# Patient Record
Sex: Male | Born: 1990 | Hispanic: Yes | Marital: Married | Smoking: Former smoker
Health system: Southern US, Community
[De-identification: ages and names within clinical notes are randomized; demographics above are authoritative.]

## PROBLEM LIST (undated history)

## (undated) DIAGNOSIS — J45909 Unspecified asthma, uncomplicated: Secondary | ICD-10-CM

## (undated) HISTORY — PX: TONSILLECTOMY: SUR1361

## (undated) HISTORY — PX: INNER EAR SURGERY: SHX679

---
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
## 2011-11-12 ENCOUNTER — Emergency Department: Payer: Self-pay | Admitting: Unknown Physician Specialty

## 2014-05-20 ENCOUNTER — Emergency Department: Payer: Self-pay | Admitting: Emergency Medicine

## 2014-11-11 ENCOUNTER — Emergency Department: Payer: Self-pay | Admitting: Emergency Medicine

## 2014-11-12 LAB — COMPREHENSIVE METABOLIC PANEL
ANION GAP: 6 — AB (ref 7–16)
Albumin: 4 g/dL (ref 3.4–5.0)
Alkaline Phosphatase: 103 U/L
BUN: 11 mg/dL (ref 7–18)
Bilirubin,Total: 0.6 mg/dL (ref 0.2–1.0)
CALCIUM: 8.7 mg/dL (ref 8.5–10.1)
CHLORIDE: 107 mmol/L (ref 98–107)
Co2: 27 mmol/L (ref 21–32)
Creatinine: 0.69 mg/dL (ref 0.60–1.30)
EGFR (African American): >60
GLUCOSE: 90 mg/dL (ref 65–99)
Osmolality: 278 (ref 275–301)
Potassium: 4.2 mmol/L (ref 3.5–5.1)
SGOT(AST): 40 U/L — ABNORMAL HIGH (ref 15–37)
SGPT (ALT): 63 U/L
SODIUM: 140 mmol/L (ref 136–145)
Total Protein: 7.6 g/dL (ref 6.4–8.2)

## 2014-11-12 LAB — CBC
HCT: 47.4 % (ref 40.0–52.0)
HGB: 15.9 g/dL (ref 13.0–18.0)
MCH: 29.7 pg (ref 26.0–34.0)
MCHC: 33.4 g/dL (ref 32.0–36.0)
MCV: 89 fL (ref 80–100)
PLATELETS: 327 10*3/uL (ref 150–440)
RBC: 5.35 10*6/uL (ref 4.40–5.90)
RDW: 12.9 % (ref 11.5–14.5)
WBC: 9 10*3/uL (ref 3.8–10.6)

## 2014-11-12 LAB — ETHANOL: Ethanol: <3 mg/dL

## 2015-02-14 ENCOUNTER — Emergency Department: Payer: Self-pay | Admitting: Emergency Medicine

## 2016-06-27 ENCOUNTER — Emergency Department
Admission: EM | Admit: 2016-06-27 | Discharge: 2016-06-27 | Disposition: A | Discharge status: Home / Self Care | Payer: Self-pay | Attending: Emergency Medicine | Admitting: Emergency Medicine

## 2016-06-27 ENCOUNTER — Encounter: Payer: Self-pay | Admitting: Emergency Medicine

## 2016-06-27 DIAGNOSIS — J45909 Unspecified asthma, uncomplicated: Secondary | ICD-10-CM | POA: Insufficient documentation

## 2016-06-27 DIAGNOSIS — Z87891 Personal history of nicotine dependence: Secondary | ICD-10-CM | POA: Insufficient documentation

## 2016-06-27 DIAGNOSIS — H6693 Otitis media, unspecified, bilateral: Secondary | ICD-10-CM | POA: Insufficient documentation

## 2016-06-27 HISTORY — DX: Unspecified asthma, uncomplicated: J45.909

## 2016-06-27 MED ORDER — AMOXICILLIN-POT CLAVULANATE 875-125 MG PO TABS
1.0000 | ORAL_TABLET | Freq: Once | ORAL | Status: AC
  Administered 2016-06-27: 1 via ORAL

## 2016-06-27 MED ORDER — AMOXICILLIN-POT CLAVULANATE 875-125 MG PO TABS
ORAL_TABLET | ORAL | Status: AC
  Administered 2016-06-27: 1 via ORAL
  Filled 2016-06-27: qty 1

## 2016-06-27 MED ORDER — AMOXICILLIN-POT CLAVULANATE 875-125 MG PO TABS
1.0000 | ORAL_TABLET | Freq: Two times a day (BID) | ORAL | 0 refills | Status: AC
Start: 2016-06-27 — End: 2016-07-07

## 2016-06-27 NOTE — ED Provider Notes (Signed)
[facility] [place] Emergency Department Provider Note  ____________________________________________   First MD Initiated Contact with Patient 06/27/16 [phone]     (approximate)  I have reviewed the triage vital signs and the nursing notes.   HISTORY  Chief Complaint Otalgia   HPI Cory Sanchez is a 25 y.o. male presents with right earache 2 weeks after going [place] the [place]. Patient denies any fever no nausea no vomiting.   Past Medical History:  Diagnosis Date  . Asthma     There are no active problems to display for this patient.   Past Surgical History:  Procedure Laterality Date  . INNER EAR SURGERY    . TONSILLECTOMY      Prior to Admission medications   Not on File    Allergies Review of patient's allergies indicates no known allergies.  No family history on file.  Social History Social History  Substance Use Topics  . Smoking status: Former Games developer  . Smokeless tobacco: Never Used  . Alcohol use Not on file    Review of Systems Constitutional: No fever/chills Eyes: No visual changes. ENT: No sore throat.Positive for right pain Cardiovascular: Denies chest pain. Respiratory: Denies shortness of breath. Gastrointestinal: No abdominal pain.  No nausea, no vomiting.  No diarrhea.  No constipation. Genitourinary: Negative for dysuria. Musculoskeletal: Negative for back pain. Skin: Negative for rash. Neurological: Negative for headaches, focal weakness or numbness.  10-point ROS otherwise negative.  ____________________________________________   PHYSICAL EXAM:  VITAL SIGNS: ED Triage Vitals  Enc Vitals Group     BP 06/27/16 0009 (!) 154/100     Pulse Rate 06/27/16 0009 (!) 101     Resp 06/27/16 0009 18     Temp 06/27/16 0009 97.9 F (36.6 C)     Temp Source 06/27/16 0009 Oral     SpO2 06/27/16 0009 98 %     Weight 06/27/16 0009 224 lb (101.6 kg)     Height 06/27/16 0009 5\' 8"  (1.727 m)     Head  Circumference --      Peak Flow --      Pain Score 06/27/16 0010 7     Pain Loc --      Pain Edu? --      Excl. in GC? --     Constitutional: Alert and oriented. Well appearing and in no acute distress. Eyes: Conjunctivae are normal. PERRL. EOMI. Head: Atraumatic. Ears:  Bilateral TM erythema and bulging tympanic membrane on the right Nose: No congestion/rhinnorhea. Mouth/Throat: Mucous membranes are moist.  Oropharynx non-erythematous. Neck: No stridor.  No meningeal signs. Cardiovascular: Normal rate, regular rhythm. Good peripheral circulation. Grossly normal heart sounds.   Respiratory: Normal respiratory effort.  No retractions. Lungs CTAB. Gastrointestinal: Soft and nontender. No distention.  Musculoskeletal: No lower extremity tenderness nor edema. No gross deformities of extremities. Neurologic:  Normal speech and language. No gross focal neurologic deficits are appreciated.  Skin:  Skin is warm, dry and intact. No rash noted.   ____________________________________________   LABS (all labs ordered are listed, but only abnormal results are displayed)  Labs Reviewed - No data to display   No results found.  ____________________________________________    Procedures   ____________________________________________   INITIAL IMPRESSION / ASSESSMENT AND PLAN / ED COURSE  Pertinent labs & imaging results that were available during my care of the patient were reviewed by me and considered in my medical decision making (see chart for details).  Patient given Augmentin 875 mg  will be prescribed same for home.  Clinical Course    ____________________________________________  FINAL CLINICAL IMPRESSION(S) / ED DIAGNOSES  Final diagnoses:  Bilateral acute otitis media, recurrence not specified, unspecified otitis media type     MEDICATIONS GIVEN DURING THIS VISIT:  Medications  amoxicillin-clavulanate (AUGMENTIN) 875-125 MG per tablet 1 tablet (1 tablet Oral  Given 06/27/16 0419)     NEW OUTPATIENT MEDICATIONS STARTED DURING THIS VISIT:  New Prescriptions   No medications on file      Note:  This document was prepared using Dragon voice recognition software and may include unintentional dictation errors.    Darci Current, MD 06/27/16 [phone]

## 2016-06-27 NOTE — ED Triage Notes (Signed)
Patient reports that he went swimming two weeks ago and has had right ear pain since. Patient states that he has had similar problems in the past that required surgery.

## 2019-11-07 DIAGNOSIS — Y999 Unspecified external cause status: Secondary | ICD-10-CM | POA: Insufficient documentation

## 2019-11-07 DIAGNOSIS — Z2914 Encounter for prophylactic rabies immune globin: Secondary | ICD-10-CM | POA: Insufficient documentation

## 2019-11-07 DIAGNOSIS — Y939 Activity, unspecified: Secondary | ICD-10-CM | POA: Insufficient documentation

## 2019-11-07 DIAGNOSIS — J45909 Unspecified asthma, uncomplicated: Secondary | ICD-10-CM | POA: Insufficient documentation

## 2019-11-07 DIAGNOSIS — Z203 Contact with and (suspected) exposure to rabies: Secondary | ICD-10-CM | POA: Insufficient documentation

## 2019-11-07 DIAGNOSIS — W540XXA Bitten by dog, initial encounter: Secondary | ICD-10-CM | POA: Insufficient documentation

## 2019-11-07 DIAGNOSIS — Z87891 Personal history of nicotine dependence: Secondary | ICD-10-CM | POA: Insufficient documentation

## 2019-11-07 DIAGNOSIS — Y9289 Other specified places as the place of occurrence of the external cause: Secondary | ICD-10-CM | POA: Insufficient documentation

## 2019-11-07 DIAGNOSIS — S71151A Open bite, right thigh, initial encounter: Secondary | ICD-10-CM | POA: Insufficient documentation

## 2019-11-07 DIAGNOSIS — Z23 Encounter for immunization: Secondary | ICD-10-CM | POA: Insufficient documentation

## 2019-11-08 ENCOUNTER — Emergency Department
Admission: EM | Admit: 2019-11-08 | Discharge: 2019-11-08 | Disposition: A | Discharge status: Home / Self Care | Payer: Self-pay | Attending: Emergency Medicine | Admitting: Emergency Medicine

## 2019-11-08 ENCOUNTER — Emergency Department: Payer: Self-pay

## 2019-11-08 ENCOUNTER — Other Ambulatory Visit: Payer: Self-pay

## 2019-11-08 DIAGNOSIS — W540XXA Bitten by dog, initial encounter: Secondary | ICD-10-CM

## 2019-11-08 MED ORDER — OXYCODONE-ACETAMINOPHEN 5-325 MG PO TABS: 1.0000 | ORAL_TABLET | ORAL | 0 refills | Status: AC | PRN

## 2019-11-08 MED ORDER — RABIES VACCINE, PCEC IM SUSR
1.0000 mL | Freq: Once | INTRAMUSCULAR | Status: AC
Start: 2019-11-08 — End: 2019-11-08
  Administered 2019-11-08: 05:00:00 1 mL via INTRAMUSCULAR
  Filled 2019-11-08: qty 1

## 2019-11-08 MED ORDER — AMOXICILLIN-POT CLAVULANATE 875-125 MG PO TABS
1.0000 | ORAL_TABLET | Freq: Once | ORAL | Status: AC
  Administered 2019-11-08: 1 via ORAL
  Filled 2019-11-08: qty 1

## 2019-11-08 MED ORDER — AMOXICILLIN-POT CLAVULANATE 875-125 MG PO TABS: 1.0000 | ORAL_TABLET | Freq: Two times a day (BID) | ORAL | 0 refills | Status: AC

## 2019-11-08 MED ORDER — RABIES IMMUNE GLOBULIN 150 UNIT/ML IM INJ
20.0000 [IU]/kg | INJECTION | Freq: Once | INTRAMUSCULAR | Status: AC
  Administered 2019-11-08: 2175 [IU] via INTRAMUSCULAR
  Filled 2019-11-08: qty 14.5

## 2019-11-08 MED ORDER — OXYCODONE-ACETAMINOPHEN 5-325 MG PO TABS
1.0000 | ORAL_TABLET | Freq: Once | ORAL | Status: AC
  Administered 2019-11-08: 04:00:00 1 via ORAL
  Filled 2019-11-08: qty 1

## 2019-11-08 MED ORDER — IBUPROFEN 600 MG PO TABS
600.0000 mg | ORAL_TABLET | Freq: Once | ORAL | Status: AC
  Administered 2019-11-08: 01:00:00 600 mg via ORAL

## 2019-11-08 NOTE — ED Notes (Signed)
Dog that bit pt already in custody per pt report. Pt reports dog is being kept in custody by police for 10 days.

## 2019-11-08 NOTE — ED Provider Notes (Signed)
[facility] Emergency Department Provider Note ____________   First MD Initiated Contact with Patient 11/08/19 [phone]     (approximate)  I have reviewed the triage vital signs and the nursing notes.   HISTORY  Chief Complaint Animal Bite     HPI Cory Sanchez is a 28 y.o. male presents emergency department secondary to dog bite to the right thigh.  Patient states that he was bit by his neighbors dog who is not up-to-date with his vaccinations.  Bite was unprovoked per the patient.  Patient states that the dog was taken into quarantine by animal control.  Current pain score 9 out of 10.        Past Medical History:  Diagnosis Date  . Asthma     There are no problems to display for this patient.   Past Surgical History:  Procedure Laterality Date  . INNER EAR SURGERY    . TONSILLECTOMY      Prior to Admission medications   Not on File    Allergies Patient has no known allergies.  No family history on file.  Social History Social History   Tobacco Use  . Smoking status: Former Research scientist (life sciences)  . Smokeless tobacco: Never Used  Substance Use Topics  . Alcohol use: Never  . Drug use: Never    Review of Systems Constitutional: No fever/chills Eyes: No visual changes. ENT: No sore throat. Cardiovascular: Denies chest pain. Respiratory: Denies shortness of breath. Gastrointestinal: No abdominal pain.  No nausea, no vomiting.  No diarrhea.  No constipation. Genitourinary: Negative for dysuria. Musculoskeletal: Negative for neck pain.  Negative for back pain. Integumentary: Positive for dog bite to the right thigh Neurological: Negative for headaches, focal weakness or numbness. __________________   PHYSICAL EXAM:  VITAL SIGNS: ED Triage Vitals [11/08/19 0010]  Enc Vitals Group     BP (!) 148/92     Pulse Rate 96     Resp 18     Temp (!) 97.5 F (36.4 C)     Temp Source Oral     SpO2 99 %     Weight 108.9 kg (240 lb)       Height 1.702 m (5\' 7" )     Head Circumference      Peak Flow      Pain Score 9     Pain Loc      Pain Edu?      Excl. in [place]?     Constitutional: Alert and oriented.  Eyes: Conjunctivae are normal.  Head: Atraumatic. Mouth/Throat: Patient is wearing a mask. Neck: No stridor.  No meningeal signs.   Cardiovascular: Normal rate, regular rhythm. Good peripheral circulation. Grossly normal heart sounds. Respiratory: Normal respiratory effort.  No retractions. Gastrointestinal: Soft and nontender. No distention.  Musculoskeletal: No lower extremity tenderness nor edema. No gross deformities of extremities. Neurologic:  Normal speech and language. No gross focal neurologic deficits are appreciated.  Skin:  Skin is warm, dry and intact. Psychiatric: Mood and affect are normal. Speech and behavior are normal.   RADIOLOGY I, Excel, personally viewed and evaluated these images (plain radiographs) as part of my medical decision making, as well as reviewing the written report by the radiologist.  ED MD interpretation: No acute osseous abnormality or retained foreign body noted on right thigh x-ray.  Official radiology report(s): DG Femur Min 2 Views Right  Result Date: 11/08/2019 CLINICAL DATA:  Dog bite to lateral thigh EXAM: RIGHT FEMUR 2 VIEWS  COMPARISON:  None. FINDINGS: There is no evidence of fracture or other focal bone lesions. Soft tissues are unremarkable. No radiopaque foreign body. IMPRESSION: No acute osseous abnormality or retained foreign body. Electronically Signed   By: Deatra Robinson M.D.   On: 11/08/2019 04:24      Procedures   ____________________________________________   INITIAL IMPRESSION / MDM / ASSESSMENT AND PLAN / ED COURSE  As part of my medical decision making, I reviewed the following data within the electronic MEDICAL RECORD NUMBER   28 year old male presenting with above-stated history and physical exam secondary to dog bite.  Given the fact  that the dog's vaccination was not up-to-date patient given rabies shots in the emergency department tetanus and Augmentin.       ____________________________________________  FINAL CLINICAL IMPRESSION(S) / ED DIAGNOSES  Final diagnoses:  Dog bite, initial encounter     MEDICATIONS GIVEN DURING THIS VISIT:  Medications  rabies immune globulin (HYPERAB/KEDRAB) injection 2,175 Units (has no administration in time range)  rabies vaccine (RABAVERT) injection 1 mL (has no administration in time range)  amoxicillin-clavulanate (AUGMENTIN) 875-125 MG per tablet 1 tablet (has no administration in time range)  ibuprofen (ADVIL) tablet 600 mg (600 mg Oral Given 11/08/19 0030)  oxyCODONE-acetaminophen (PERCOCET/ROXICET) 5-325 MG per tablet 1 tablet (1 tablet Oral Given 11/08/19 0358)     ED Discharge Orders    None      *Please note:  Cory Sanchez was evaluated in Emergency Department on 11/08/2019 for the symptoms described in the history of present illness. He was evaluated in the context of the global COVID-19 pandemic, which necessitated consideration that the patient might be at risk for infection with the SARS-CoV-2 virus that causes COVID-19. Institutional protocols and algorithms that pertain to the evaluation of patients at risk for COVID-19 are in a state of rapid change based on information released by regulatory bodies including the CDC and federal and state organizations. These policies and algorithms were followed during the patient's care in the ED.  Some ED evaluations and interventions may be delayed as a result of limited staffing during the pandemic.*  Note:  This document was prepared using Dragon voice recognition software and may include unintentional dictation errors.   Darci Current, MD 11/08/19 [phone]

## 2019-11-08 NOTE — ED Triage Notes (Signed)
Pt to the er for a dog bite to the right thigh. Pt states it was a big dog that belonged to the neighbors. Police came out and dog is not up to date on his shots.

## 2019-11-08 NOTE — ED Notes (Signed)
Pt wound cleaned and bandage applied per MD brown order. No bleeding noted at the site.

## 2019-11-11 ENCOUNTER — Encounter: Payer: Self-pay | Admitting: Emergency Medicine

## 2019-11-11 ENCOUNTER — Emergency Department
Admission: EM | Admit: 2019-11-11 | Discharge: 2019-11-11 | Disposition: A | Discharge status: Home / Self Care | Payer: Self-pay | Attending: Emergency Medicine | Admitting: Emergency Medicine

## 2019-11-11 ENCOUNTER — Other Ambulatory Visit: Payer: Self-pay

## 2019-11-11 DIAGNOSIS — Z203 Contact with and (suspected) exposure to rabies: Secondary | ICD-10-CM | POA: Insufficient documentation

## 2019-11-11 DIAGNOSIS — Z23 Encounter for immunization: Secondary | ICD-10-CM | POA: Insufficient documentation

## 2019-11-11 MED ORDER — RABIES VACCINE, PCEC IM SUSR
1.0000 mL | Freq: Once | INTRAMUSCULAR | Status: AC
  Administered 2019-11-11: 1 mL via INTRAMUSCULAR
  Filled 2019-11-11: qty 1

## 2019-11-11 NOTE — ED Triage Notes (Signed)
Pt presents to ED for 2nd rabies vaccine. No other complaints.

## 2019-11-11 NOTE — ED Provider Notes (Signed)
[facility] Emergency Department Provider Note  ____________________________________________   First MD Initiated Contact with Patient 11/11/19 2223     (approximate)  I have reviewed the triage vital signs and the nursing notes.   HISTORY  Chief Complaint Rabies Injection    HPI Cory Sanchez is a 28 y.o. male presents emergency department for repeat administration of rabies vaccine.  Had original immunoglobulin and immunization 3 days ago.  Patient states he has had no difficulty with the injection sites.  States that the dog bite wound is healing.  No fever or chills.    Past Medical History:  Diagnosis Date  . Asthma     There are no problems to display for this patient.   Past Surgical History:  Procedure Laterality Date  . INNER EAR SURGERY    . TONSILLECTOMY      Prior to Admission medications   Medication Sig Start Date End Date Taking? Authorizing Provider  amoxicillin-clavulanate (AUGMENTIN) 875-125 MG tablet Take 1 tablet by mouth 2 (two) times daily for 10 days. 11/08/19 11/18/19  Darci Current, MD  oxyCODONE-acetaminophen (PERCOCET) 5-325 MG tablet Take 1 tablet by mouth every 4 (four) hours as needed for severe pain. 11/08/19 11/07/20  Darci Current, MD    Allergies Patient has no known allergies.  History reviewed. No pertinent family history.  Social History Social History   Tobacco Use  . Smoking status: Former Games developer  . Smokeless tobacco: Never Used  Substance Use Topics  . Alcohol use: Never  . Drug use: Never    Review of Systems  Constitutional: No fever/chills Eyes: No visual changes. ENT: No sore throat. Respiratory: Denies cough Genitourinary: Negative for dysuria. Musculoskeletal: Negative for back pain. Skin: Negative for rash.    ____________________________________________   PHYSICAL EXAM:  VITAL SIGNS: ED Triage Vitals [11/11/19 2227]  Enc Vitals Group     BP 122/77       Pulse Rate 95     Resp 18     Temp 97.9 F (36.6 C)     Temp Source Oral     SpO2 96 %     Weight 240 lb (108.9 kg)     Height 5\' 7"  (1.702 m)     Head Circumference      Peak Flow      Pain Score 0     Pain Loc      Pain Edu?      Excl. in GC?     Constitutional: Alert and oriented. Well appearing and in no acute distress. Eyes: Conjunctivae are normal.  Head: Atraumatic. Nose: No congestion/rhinnorhea. Mouth/Throat: Mucous membranes are moist.   Cardiovascular: Normal rate, regular rhythm. Respiratory: Normal respiratory effort.  No retractions,  GU: deferred Musculoskeletal: FROM all extremities, warm and well perfused Neurologic:  Normal speech and language.  Skin:  Skin is warm, dry and intact. No rash noted. Psychiatric: Mood and affect are normal. Speech and behavior are normal.  ____________________________________________   LABS (all labs ordered are listed, but only abnormal results are displayed)  Labs Reviewed - No data to display ____________________________________________   ____________________________________________  RADIOLOGY    ____________________________________________   PROCEDURES  Procedure(s) performed: Rabies vaccine   Procedures    ____________________________________________   INITIAL IMPRESSION / ASSESSMENT AND PLAN / ED COURSE  Pertinent labs & imaging results that were available during my care of the patient were reviewed by me and considered in my medical decision making (see chart for  details).   Patient is 28 year old male presents emergency department for second rabies vaccine.  Patient appears well.  Is afebrile.  Rabies vaccine administered by nursing staff patient was discharged in stable condition.  He is to return for his next rabies vaccine.  Encouraged him to call [place] control about the dog being quarantined.    Cory Sanchez was evaluated in Emergency Department on  11/11/2019 for the symptoms described in the history of present illness. He was evaluated in the context of the global COVID-19 pandemic, which necessitated consideration that the patient might be at risk for infection with the SARS-CoV-2 virus that causes COVID-19. Institutional protocols and algorithms that pertain to the evaluation of patients at risk for COVID-19 are in a state of rapid change based on information released by regulatory bodies including the CDC and federal and state organizations. These policies and algorithms were followed during the patient's care in the ED.   As part of my medical decision making, I reviewed the following data within the [facility] notes reviewed and incorporated, Old chart reviewed, Notes from prior ED visits and [place] Controlled Substance Database  ____________________________________________   FINAL CLINICAL IMPRESSION(S) / ED DIAGNOSES  Final diagnoses:  Encounter for repeat administration of rabies vaccination      NEW MEDICATIONS STARTED DURING THIS VISIT:  New Prescriptions   No medications on file     Note:  This document was prepared using Dragon voice recognition software and may include unintentional dictation errors.    Versie Starks, PA-C 11/11/19 2232    Carrie Mew, MD 11/11/19 [phone]

## 2019-11-11 NOTE — ED Notes (Signed)

## 2019-11-11 NOTE — Discharge Instructions (Addendum)
Follow-up with the emergency department in 4 days for another rabies vaccine.

## 2021-02-10 IMAGING — DX DG FEMUR 2+V*R*
5 series · 5 of 5 positions shown · non-contrast
Comparison: None.

CLINICAL DATA: Dog bite to lateral thigh

EXAM:
RIGHT FEMUR 2 VIEWS

[femur ap (1 of 3)]
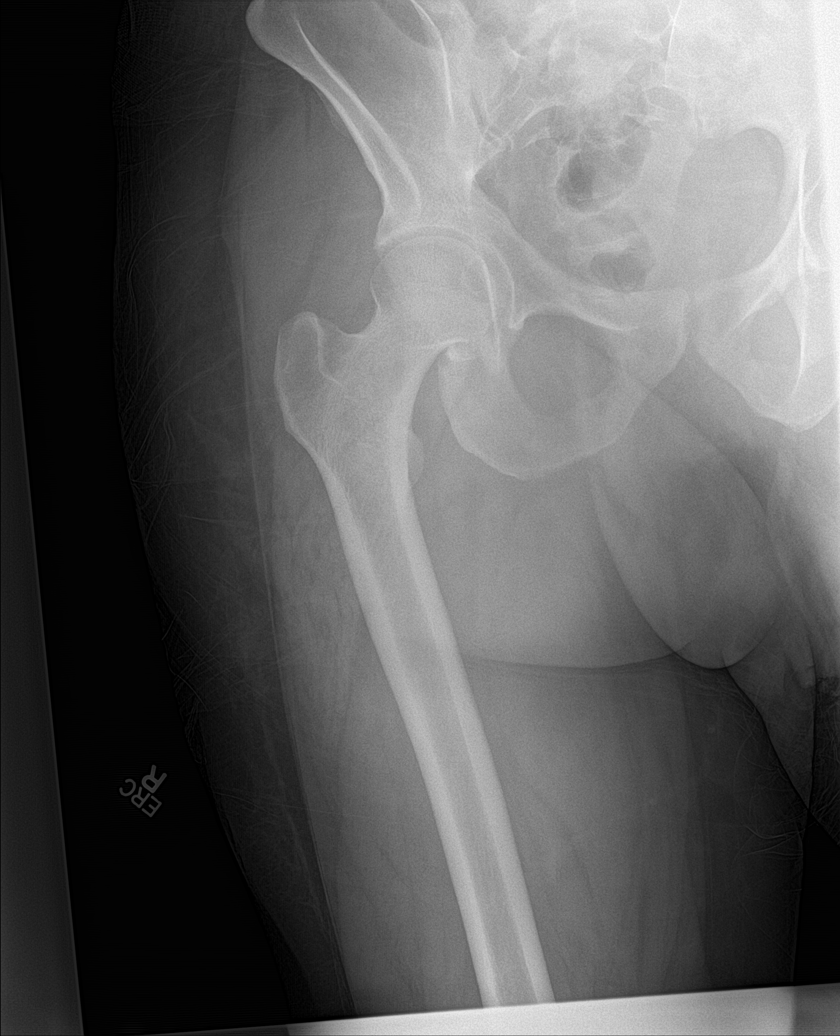

[femur ap (2 of 3)]
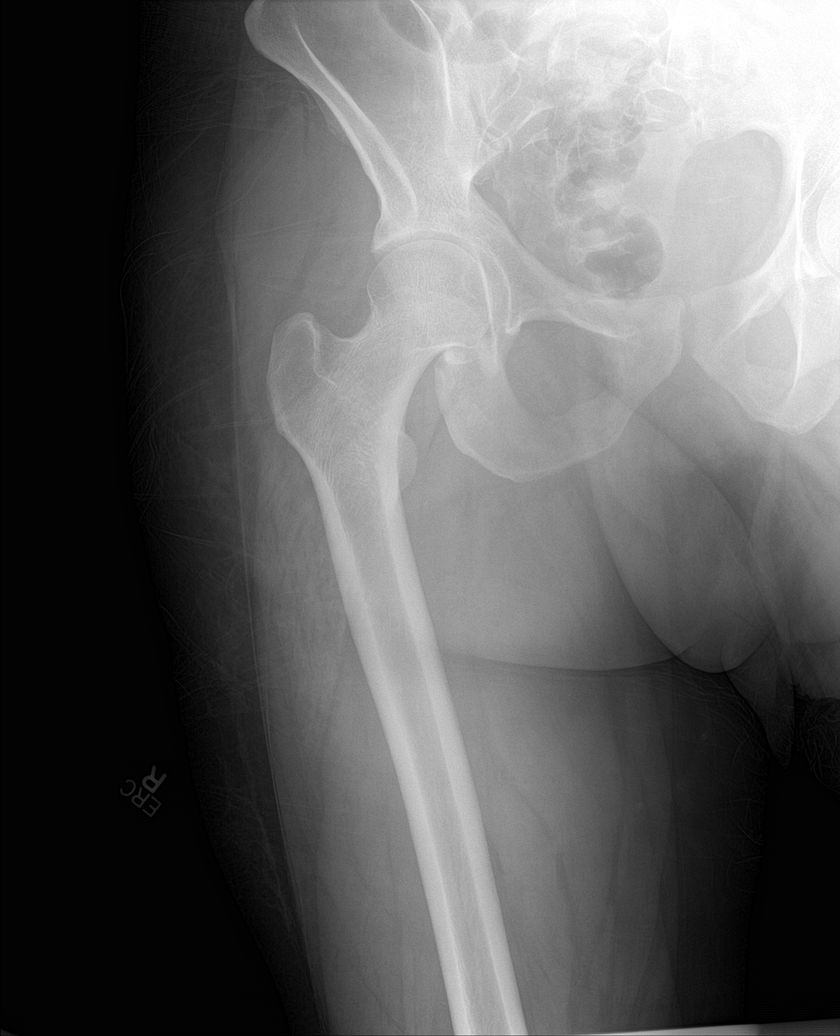

[femur ap (3 of 3)]
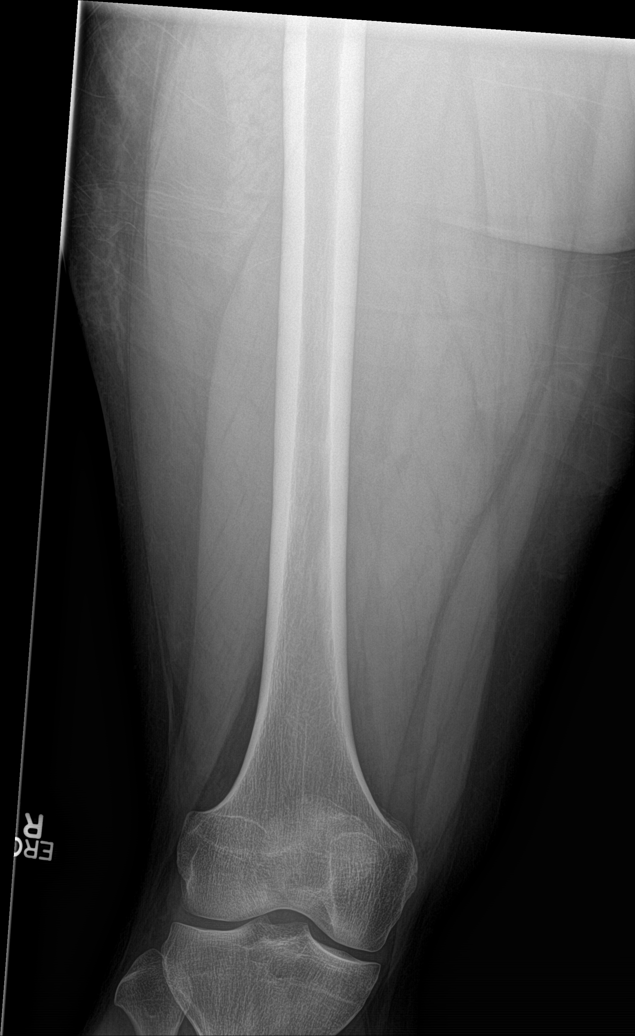

[femur lat (1 of 2)]
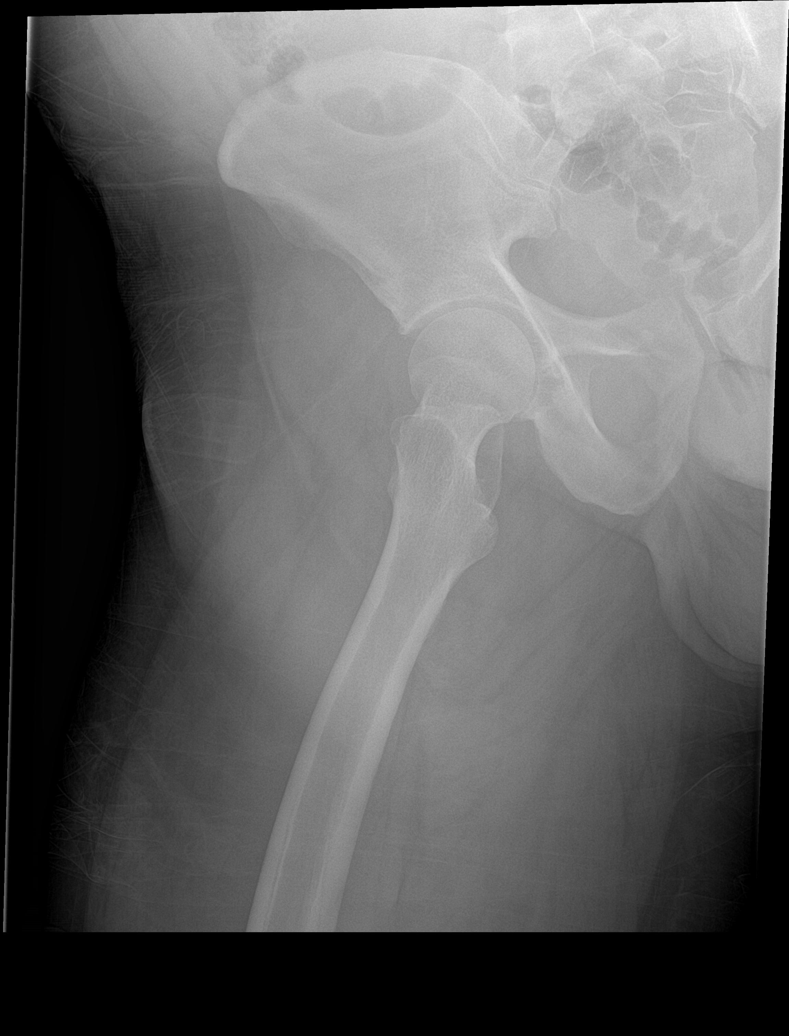

[femur lat (2 of 2)]
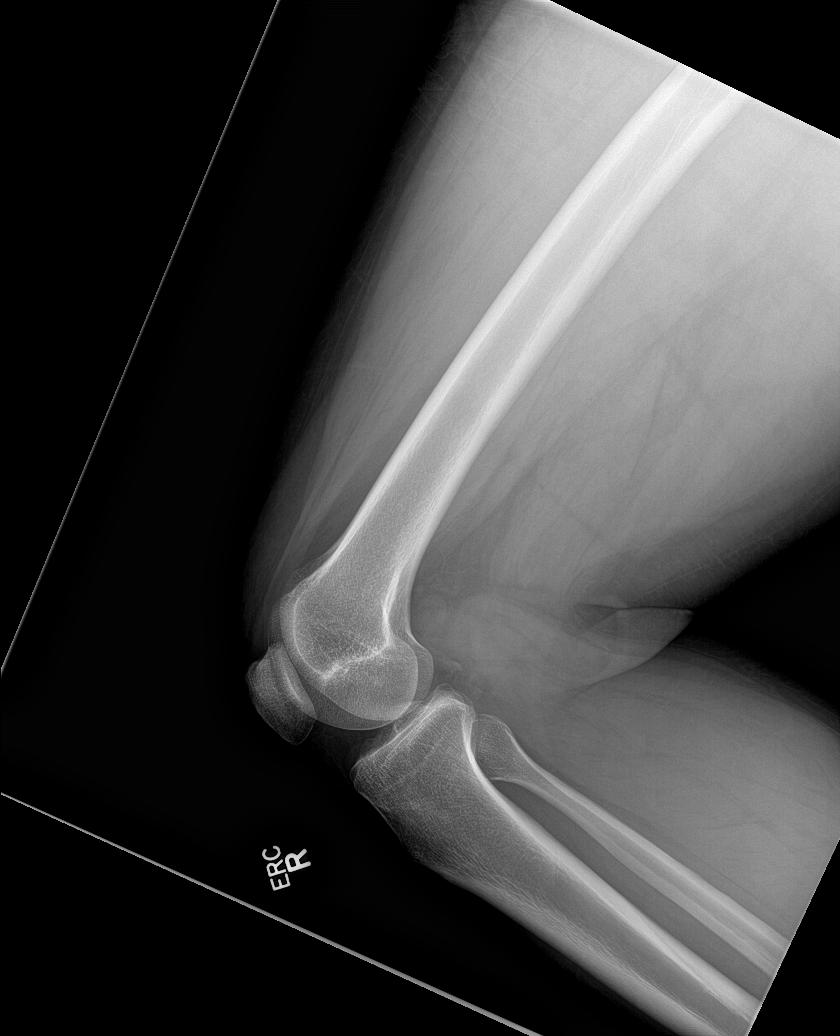

[5 of 5 positions shown; findings below may reference images not displayed]

FINDINGS: There is no evidence of fracture or other focal bone lesions. Soft
tissues are unremarkable. No radiopaque foreign body.
IMPRESSION: No acute osseous abnormality or retained foreign body.

## 2022-08-15 ENCOUNTER — Emergency Department
Admission: EM | Admit: 2022-08-15 | Discharge: 2022-08-16 | Disposition: A | Discharge status: Home / Self Care | Payer: Self-pay | Attending: Emergency Medicine | Admitting: Emergency Medicine

## 2022-08-15 ENCOUNTER — Encounter: Payer: Self-pay | Admitting: Emergency Medicine

## 2022-08-15 DIAGNOSIS — H1033 Unspecified acute conjunctivitis, bilateral: Secondary | ICD-10-CM | POA: Insufficient documentation

## 2022-08-15 DIAGNOSIS — J45909 Unspecified asthma, uncomplicated: Secondary | ICD-10-CM | POA: Insufficient documentation

## 2022-08-15 MED ORDER — MOXIFLOXACIN HCL 0.5 % OP SOLN: 1.0000 [drp] | Freq: Three times a day (TID) | OPHTHALMIC | 0 refills | Status: AC

## 2022-08-15 MED ORDER — MOXIFLOXACIN HCL 0.5 % OP SOLN
1.0000 [drp] | Freq: Once | OPHTHALMIC | Status: AC
  Administered 2022-08-16: 1 [drp] via OPHTHALMIC
  Filled 2022-08-15: qty 3

## 2022-08-15 NOTE — ED Provider Notes (Signed)
[facility] Provider Note    Event Date/Time   First MD Initiated Contact with Patient 08/15/22 2329     (approximate)   History   Eye Drainage   HPI  Cory Sanchez is a 31 y.o. male who presents to the ED from home with a chief complaint of bilateral eye drainage and redness x6 days.  Daughter with pinkeye.  Patient was seen at [facility] clinic yesterday with the above symptoms plus sore throat.  He was prescribed a "pill and drops".  States eyes did not improve and this morning they were completely crusted over.  Does not wear corrective lenses.  Complains of eye drainage, redness and itchiness.  Denies vision changes or blurry vision.  Denies injury, foreign body sensation, welding injury; voices no other complaints or injuries.     Past Medical History   Past Medical History:  Diagnosis Date   Asthma      Active Problem List  There are no problems to display for this patient.    Past Surgical History   Past Surgical History:  Procedure Laterality Date   INNER EAR SURGERY     TONSILLECTOMY       Home Medications   Prior to Admission medications   Medication Sig Start Date End Date Taking? Authorizing Provider  moxifloxacin (VIGAMOX) 0.5 % ophthalmic solution Place 1 drop into both eyes 3 (three) times daily for 7 days. 08/15/22 08/22/22 Yes Paulette Blanch, MD     Allergies  Patient has no known allergies.   Family History  History reviewed. No pertinent family history.   Physical Exam  Triage Vital Signs: ED Triage Vitals [08/15/22 2146]  Enc Vitals Group     BP (!) 137/95     Pulse Rate 83     Resp 17     Temp 98.5 F (36.9 C)     Temp Source Oral     SpO2 96 %     Weight 235 lb (106.6 kg)     Height 5\' 8"  (1.727 m)     Head Circumference      Peak Flow      Pain Score      Pain Loc      Pain Edu?      Excl. in [place]?     Updated Vital Signs: BP 127/72 (BP Location: Left Arm)   Pulse 71   Temp 98.2  F (36.8 C) (Oral)   Resp 17   Ht 5\' 8"  (1.727 m)   Wt 106.6 kg   SpO2 99%   BMI 35.73 kg/m    General: Awake, no distress.  CV:  Good peripheral perfusion.  Resp:  Normal effort.  Abd:  No distention.  Other:  Eyes: Bilateral conjunctivae reddened with exudate.  PERRL.  EOMI.   ED Results / Procedures / Treatments  Labs (all labs ordered are listed, but only abnormal results are displayed) Labs Reviewed - No data to display   EKG  None   RADIOLOGY None   Official radiology report(s): No results found.   PROCEDURES:  Critical Care performed: No  Procedures   MEDICATIONS ORDERED IN ED: Medications  moxifloxacin (VIGAMOX) 0.5 % ophthalmic solution 1 drop (1 drop Both Eyes Given 08/16/22 0001)     IMPRESSION / MDM / ASSESSMENT AND PLAN / ED COURSE  I reviewed the triage vital signs and the nursing notes.  31 year old healthy male presenting with conjunctivitis.  I am unable to see patient's records from [facility] but he was able to text his wife who sent him a picture of Polytrim eyedrops which is what he received yesterday.  Have asked patient to discontinue these and instead take Vigamox.  I suspect the tablet he received was an antibiotic and told him he can continue to take these and finish as did by his doctor.  Refer to ophthalmology for close follow-up.  Strict return precautions given.  Patient verbalizes understanding and agrees with plan of care.  Patient's presentation is most consistent with acute, uncomplicated illness.  FINAL CLINICAL IMPRESSION(S) / ED DIAGNOSES   Final diagnoses:  Acute conjunctivitis of both eyes, unspecified acute conjunctivitis type     Rx / DC Orders   ED Discharge Orders          Ordered    moxifloxacin (VIGAMOX) 0.5 % ophthalmic solution  3 times daily        08/15/22 2339             Note:  This document was prepared using Dragon voice recognition software and may include  unintentional dictation errors.   Irean Hong, MD 08/16/22 [phone]

## 2022-08-15 NOTE — Discharge Instructions (Signed)
Stop using the eyedrops you got from your doctor.  Instead use Vigamox 1 drop in each eye 3 times daily x7 days.  You may finish the tablets that your doctor prescribed.  Return to the ER for worsening symptoms, persistent vomiting, difficulty breathing or other concerns.

## 2022-08-15 NOTE — ED Triage Notes (Signed)
Pt c/o bilateral eye pain, drainage and redness x1 week.
